# Patient Record
Sex: Female | Born: 1995 | Race: Black or African American | Hispanic: No | Marital: Single | Smoking: Never smoker
Health system: Southern US, Community
[De-identification: ages and names within clinical notes are randomized; demographics above are authoritative.]

---
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
## 2016-06-12 IMAGING — CR DG KNEE COMPLETE 4+V*R*
4 series · 4 of 4 positions shown · non-contrast
Comparison: None.

CLINICAL DATA: 19-year-old female right knee pain for 1 week.
Medial pain. Volleyball player. Initial encounter.

EXAM:
RIGHT KNEE - COMPLETE 4+ VIEW

[knee ap (1 of 3)]
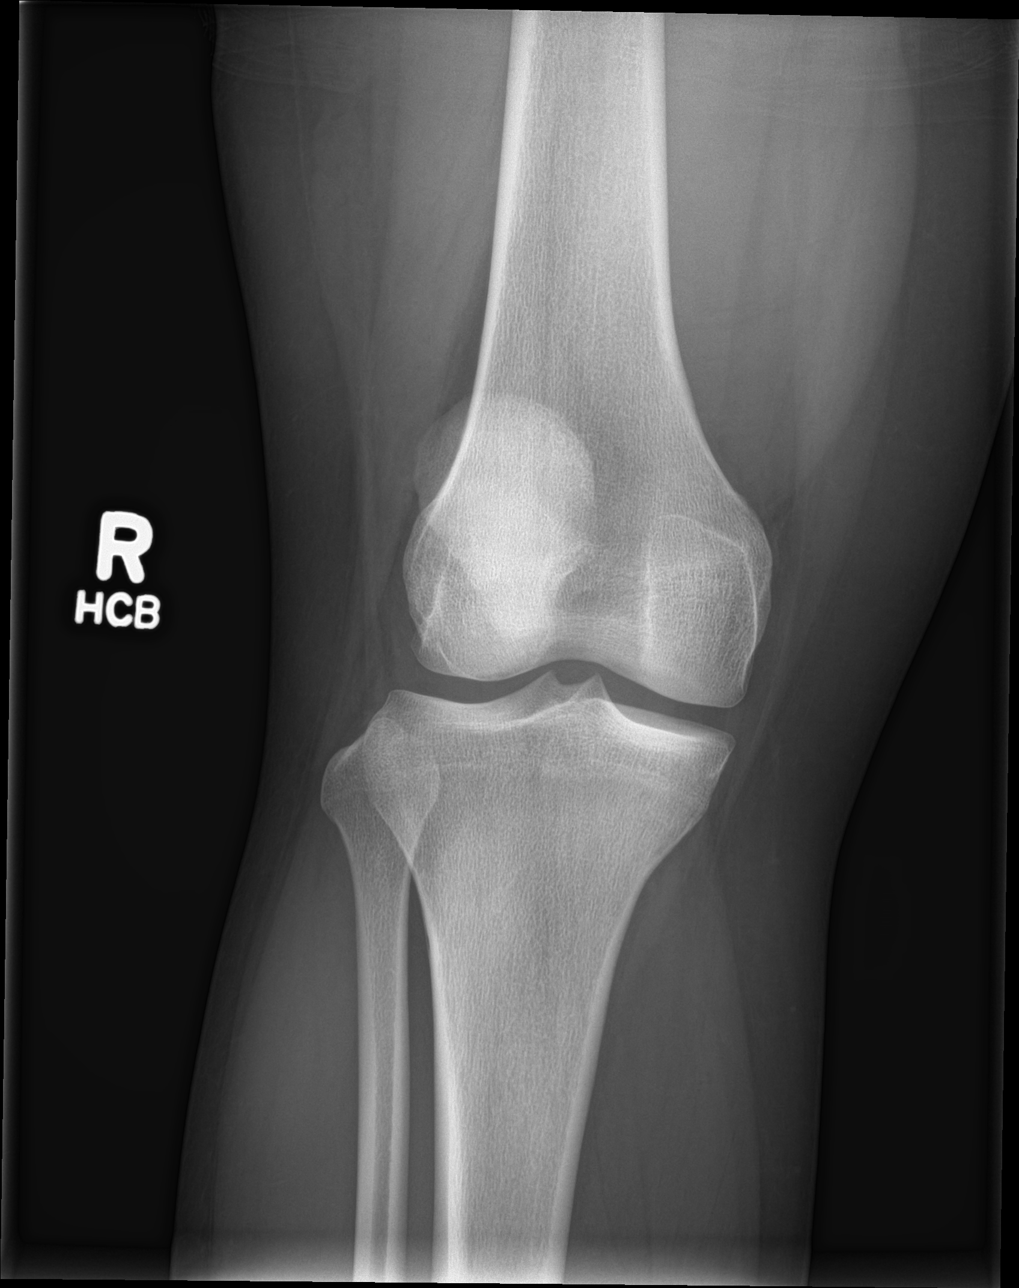

[knee lat]
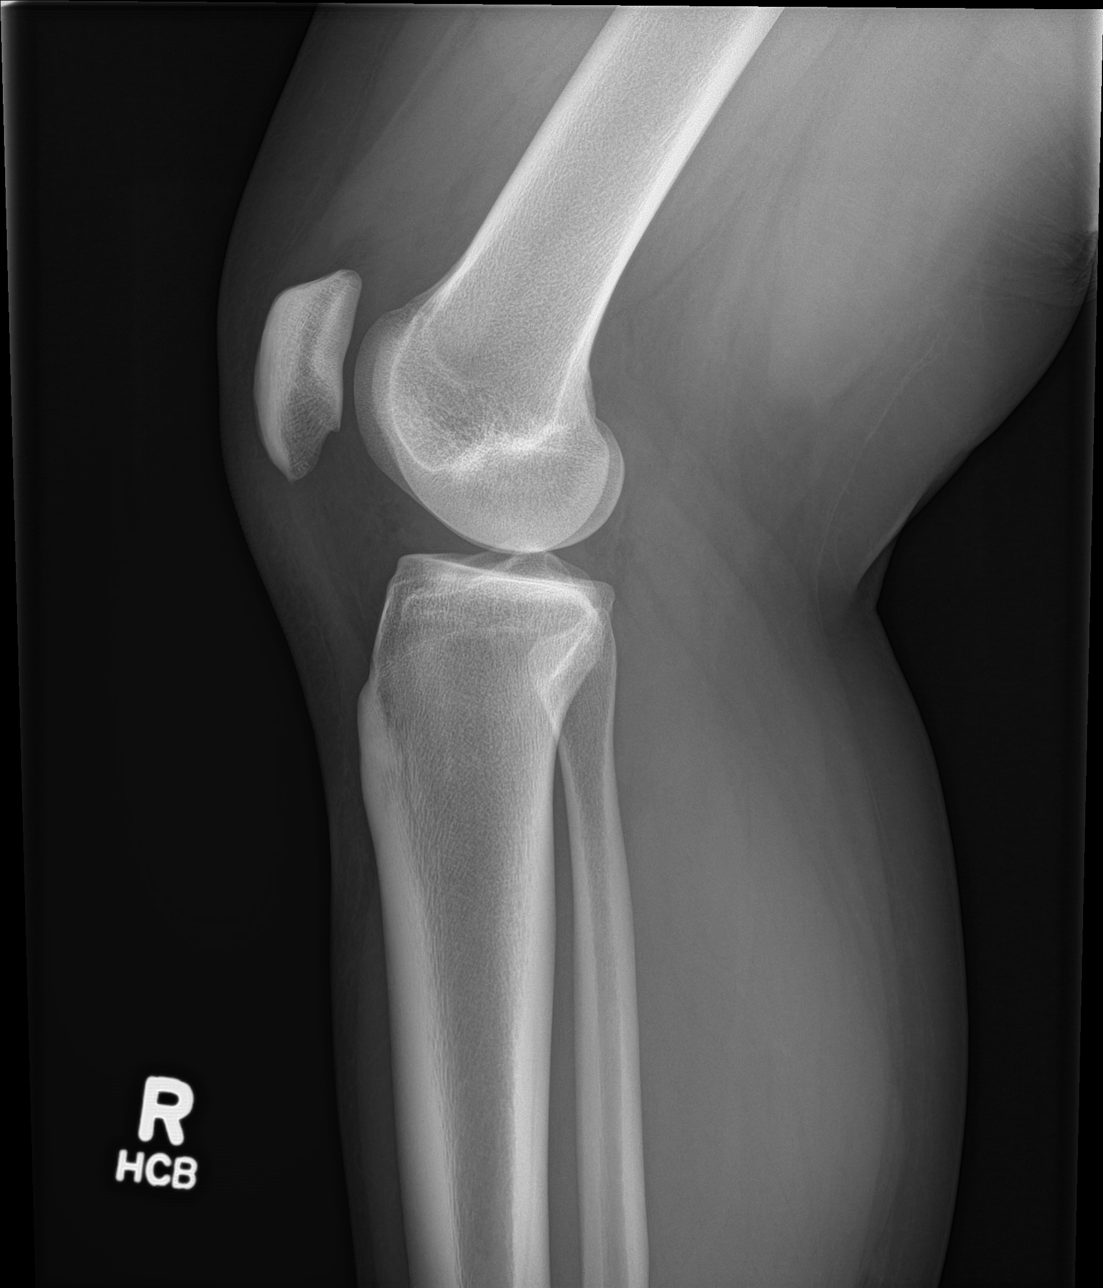

[knee ap (2 of 3)]
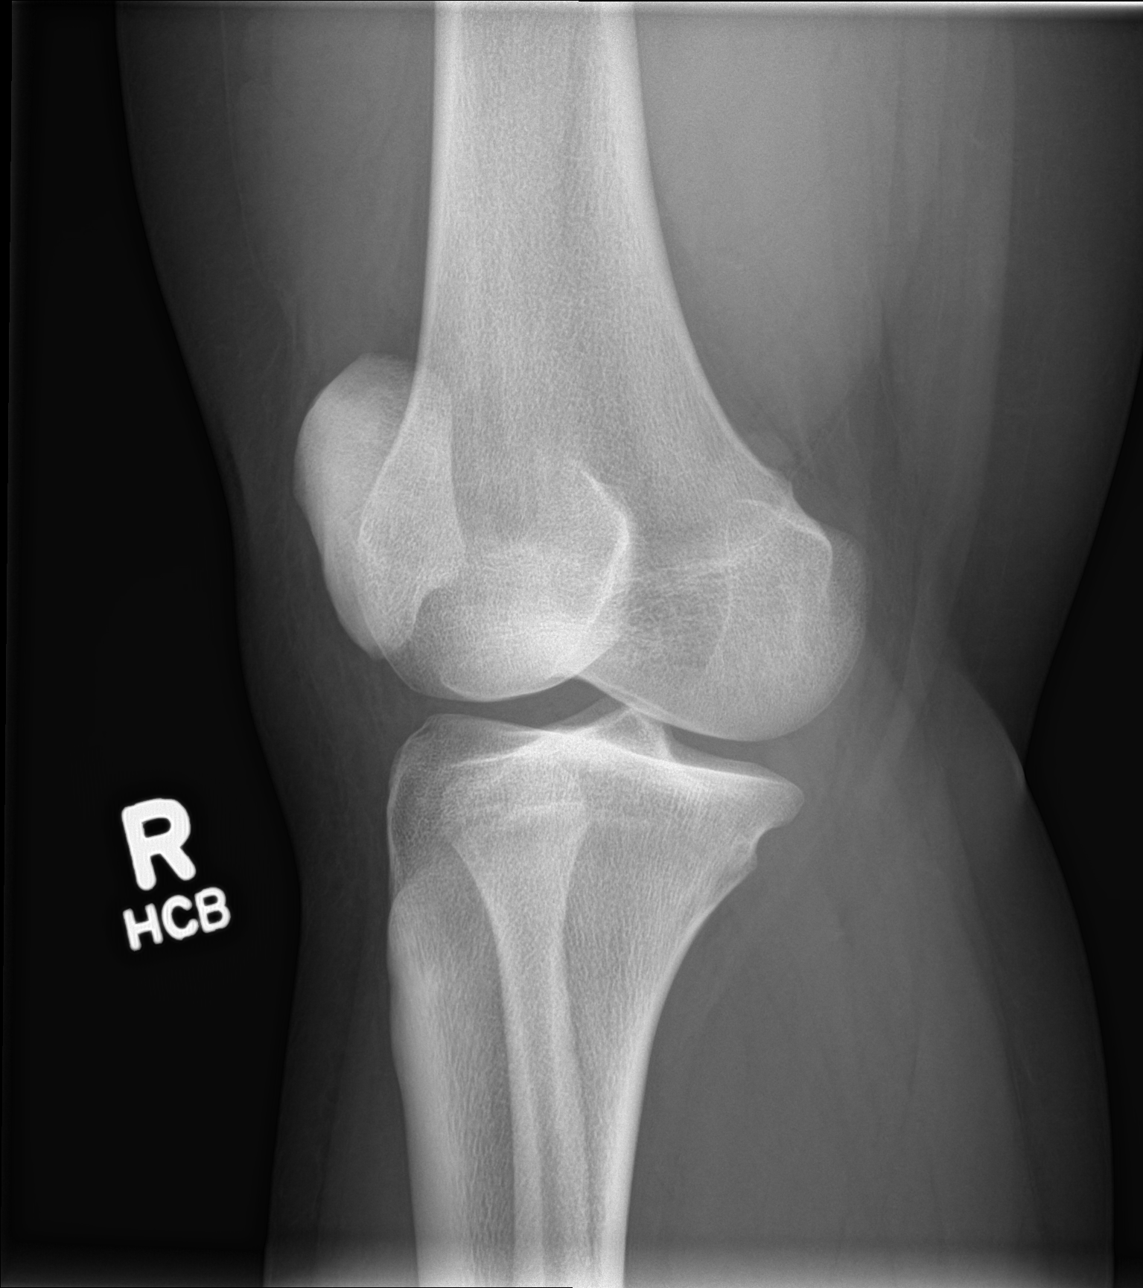

[knee ap (3 of 3)]
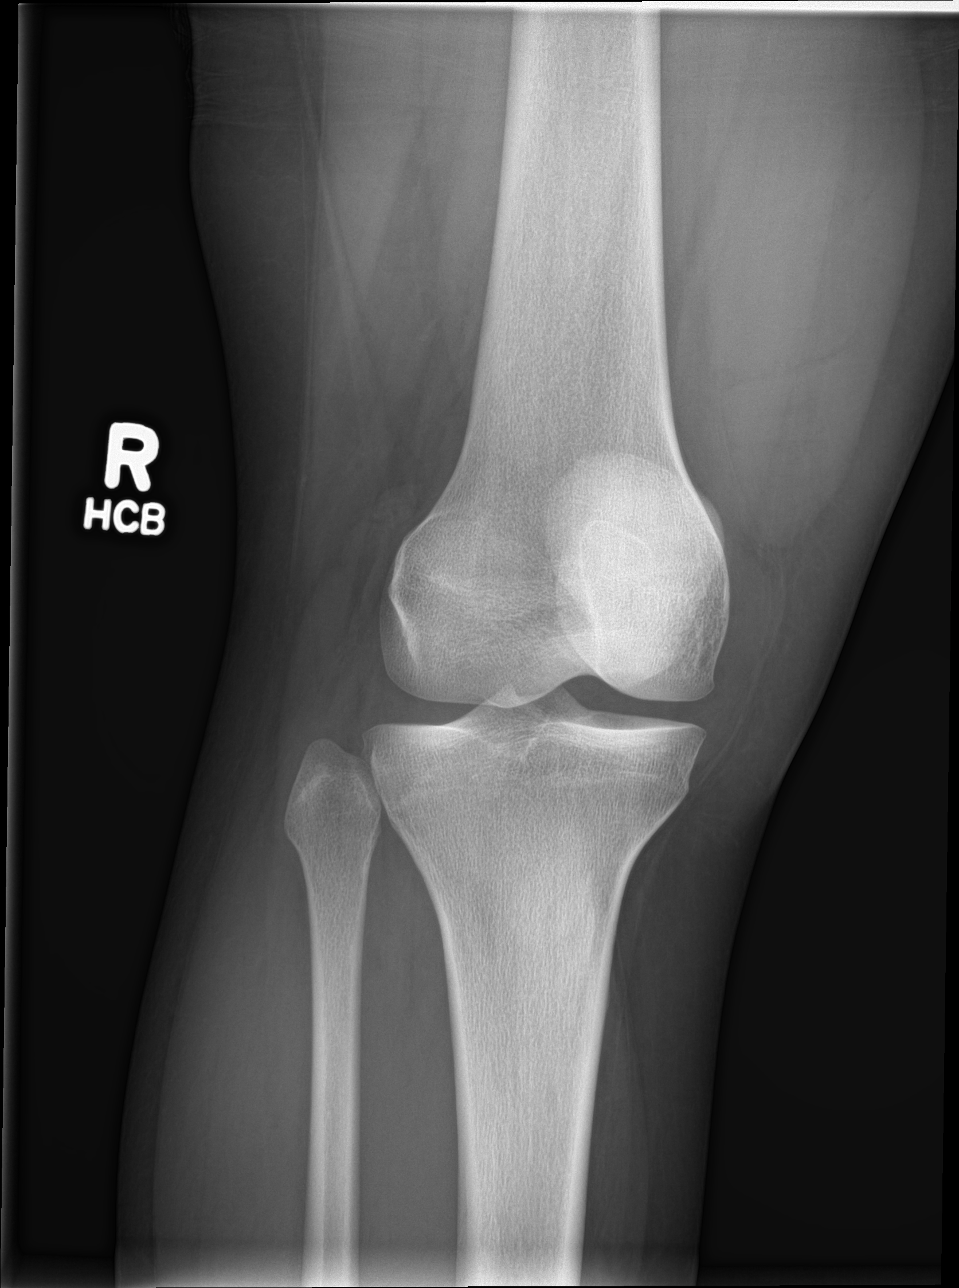

[4 of 4 positions shown; findings below may reference images not displayed]

FINDINGS: Small to moderate suprapatellar joint effusion. Bone mineralization
is within normal limits. The patella appears mildly elongated, but
intact. Joint spaces are preserved. No acute fracture identified.
IMPRESSION: Small to moderate joint effusion. No acute fracture identified about
the right knee.
